# Patient Record
Sex: Male | Born: 1990 | Hispanic: No | Marital: Single | Smoking: Current every day smoker
Health system: Southern US, Community
[De-identification: ages and names within clinical notes are randomized; demographics above are authoritative.]

---

## 2017-07-03 ENCOUNTER — Emergency Department (HOSPITAL_BASED_OUTPATIENT_CLINIC_OR_DEPARTMENT_OTHER): Payer: No Typology Code available for payment source

## 2017-07-03 ENCOUNTER — Encounter (HOSPITAL_BASED_OUTPATIENT_CLINIC_OR_DEPARTMENT_OTHER): Payer: Self-pay | Admitting: *Deleted

## 2017-07-03 ENCOUNTER — Emergency Department (HOSPITAL_BASED_OUTPATIENT_CLINIC_OR_DEPARTMENT_OTHER)
Admission: EM | Admit: 2017-07-03 | Discharge: 2017-07-03 | Disposition: A | Discharge status: Home / Self Care | Payer: No Typology Code available for payment source | Attending: Emergency Medicine | Admitting: Emergency Medicine

## 2017-07-03 DIAGNOSIS — F17210 Nicotine dependence, cigarettes, uncomplicated: Secondary | ICD-10-CM | POA: Insufficient documentation

## 2017-07-03 DIAGNOSIS — Y93I9 Activity, other involving external motion: Secondary | ICD-10-CM | POA: Insufficient documentation

## 2017-07-03 DIAGNOSIS — S63602A Unspecified sprain of left thumb, initial encounter: Secondary | ICD-10-CM | POA: Insufficient documentation

## 2017-07-03 DIAGNOSIS — S50811A Abrasion of right forearm, initial encounter: Secondary | ICD-10-CM | POA: Insufficient documentation

## 2017-07-03 DIAGNOSIS — Y999 Unspecified external cause status: Secondary | ICD-10-CM | POA: Insufficient documentation

## 2017-07-03 DIAGNOSIS — Z23 Encounter for immunization: Secondary | ICD-10-CM | POA: Insufficient documentation

## 2017-07-03 DIAGNOSIS — S40811A Abrasion of right upper arm, initial encounter: Secondary | ICD-10-CM

## 2017-07-03 DIAGNOSIS — Y929 Unspecified place or not applicable: Secondary | ICD-10-CM | POA: Insufficient documentation

## 2017-07-03 MED ORDER — NAPROXEN 500 MG PO TABS: 500.0000 mg | ORAL_TABLET | Freq: Two times a day (BID) | ORAL | 0 refills | Status: AC

## 2017-07-03 MED ORDER — IBUPROFEN 800 MG PO TABS
800.0000 mg | ORAL_TABLET | Freq: Once | ORAL | Status: AC
  Administered 2017-07-03: 800 mg via ORAL
  Filled 2017-07-03: qty 1

## 2017-07-03 MED ORDER — TETANUS-DIPHTH-ACELL PERTUSSIS 5-2.5-18.5 LF-MCG/0.5 IM SUSP
0.5000 mL | Freq: Once | INTRAMUSCULAR | Status: AC
  Administered 2017-07-03: 0.5 mL via INTRAMUSCULAR
  Filled 2017-07-03: qty 0.5
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
